# Patient Record
Sex: Male | Born: 1995 | Race: Black or African American | Hispanic: No | Marital: Single | Smoking: Current every day smoker
Health system: Southern US, Community
[De-identification: ages and names within clinical notes are randomized; demographics above are authoritative.]

## PROBLEM LIST (undated history)

## (undated) DIAGNOSIS — R1012 Left upper quadrant pain: Secondary | ICD-10-CM

## (undated) DIAGNOSIS — R079 Chest pain, unspecified: Secondary | ICD-10-CM

## (undated) DIAGNOSIS — Z789 Other specified health status: Secondary | ICD-10-CM

## (undated) HISTORY — DX: Left upper quadrant pain: R10.12

## (undated) HISTORY — DX: Chest pain, unspecified: R07.9

## (undated) SURGERY — EXCISION, CHALAZION
Anesthesia: LOCAL | Laterality: Right

---
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
## 2014-10-29 ENCOUNTER — Emergency Department (HOSPITAL_COMMUNITY): Payer: Medicaid Other

## 2014-10-29 ENCOUNTER — Encounter (HOSPITAL_COMMUNITY): Payer: Self-pay

## 2014-10-29 ENCOUNTER — Emergency Department (HOSPITAL_COMMUNITY)
Admission: EM | Admit: 2014-10-29 | Discharge: 2014-10-29 | Disposition: A | Discharge status: Home / Self Care | Payer: Medicaid Other | Attending: Emergency Medicine | Admitting: Emergency Medicine

## 2014-10-29 DIAGNOSIS — R55 Syncope and collapse: Secondary | ICD-10-CM | POA: Diagnosis not present

## 2014-10-29 DIAGNOSIS — R1110 Vomiting, unspecified: Secondary | ICD-10-CM | POA: Insufficient documentation

## 2014-10-29 DIAGNOSIS — R7989 Other specified abnormal findings of blood chemistry: Secondary | ICD-10-CM | POA: Insufficient documentation

## 2014-10-29 DIAGNOSIS — R531 Weakness: Secondary | ICD-10-CM | POA: Diagnosis not present

## 2014-10-29 LAB — BASIC METABOLIC PANEL
ANION GAP: 7 (ref 5–15)
BUN: 15 mg/dL (ref 6–20)
CHLORIDE: 103 mmol/L (ref 101–111)
CO2: 28 mmol/L (ref 22–32)
Calcium: 9.4 mg/dL (ref 8.9–10.3)
Creatinine, Ser: 1.34 mg/dL — ABNORMAL HIGH (ref 0.61–1.24)
GFR calc non Af Amer: >60 mL/min (ref >60)
Glucose, Bld: 106 mg/dL — ABNORMAL HIGH (ref 65–99)
Potassium: 4.1 mmol/L (ref 3.5–5.1)
SODIUM: 138 mmol/L (ref 135–145)

## 2014-10-29 LAB — CBC
HCT: 44.7 % (ref 39.0–52.0)
Hemoglobin: 15.2 g/dL (ref 13.0–17.0)
MCH: 30.2 pg (ref 26.0–34.0)
MCHC: 34 g/dL (ref 30.0–36.0)
MCV: 88.9 fL (ref 78.0–100.0)
PLATELETS: 180 10*3/uL (ref 150–400)
RBC: 5.03 MIL/uL (ref 4.22–5.81)
RDW: 12.5 % (ref 11.5–15.5)
WBC: 5.2 10*3/uL (ref 4.0–10.5)

## 2014-10-29 MED ORDER — SODIUM CHLORIDE 0.9 % IV BOLUS (SEPSIS)
1000.0000 mL | Freq: Once | INTRAVENOUS | Status: AC
  Administered 2014-10-29: 1000 mL via INTRAVENOUS

## 2014-10-29 NOTE — ED Notes (Signed)
CBG 93 

## 2014-10-29 NOTE — ED Provider Notes (Signed)
CSN: [id number]     Arrival date & time 10/29/14  1250 History   First MD Initiated Contact with Patient 10/29/14 1259     Chief Complaint  Patient presents with  . Near Syncope     (Consider location/radiation/quality/duration/timing/severity/associated sxs/prior Treatment) HPI  Patient presents after an episode of near-syncope. Patient was with his mother, who is here being evaluated for syncope, fall, when she became diaphoretic, lightheaded. No complete collapse, nor complete loss of consciousness. Patient states that he has not eaten substantial amounts today, had no chest pain either before or after the episode. Patient one episode of vomiting after being provided with juice following the event. He acknowledges a history of similar prior episodes over the years, including some with exertion. No firm diagnosis. Patient has a family member with long QT syndrome.  Past Medical History  Diagnosis Date  . Medical history non-contributory    Past Surgical History  Procedure Laterality Date  . Arm fx x 2 Right 2009  . Chalazion excision Right 01/10/2013    Procedure: MINOR EXCISION OF CHALAZION;  Surgeon: Vita Erm., MD;  Location: [facility];  Service: Ophthalmology;  Laterality: Right;   History reviewed. No pertinent family history. History  Substance Use Topics  . Smoking status: Never Smoker   . Smokeless tobacco: Not on file  . Alcohol Use: No    Review of Systems  Constitutional:       Per HPI, otherwise negative  HENT:       Per HPI, otherwise negative  Respiratory:       Per HPI, otherwise negative  Cardiovascular:       Per HPI, otherwise negative  Gastrointestinal: Positive for vomiting.  Endocrine:       Negative aside from HPI  Genitourinary:       Neg aside from HPI   Musculoskeletal:       Per HPI, otherwise negative  Skin: Negative.   Neurological: Positive for weakness and light-headedness. Negative for syncope.       Allergies  Review of patient's allergies indicates no known allergies.  Home Medications   Prior to Admission medications   Not on File   BP 104/56 mmHg  Pulse 66  Temp(Src) 97.9 F (36.6 C) (Oral)  Resp 18  SpO2 100% Physical Exam  Constitutional: He is oriented to person, place, and time. He appears well-developed. No distress.  HENT:  Head: Normocephalic and atraumatic.  Eyes: Conjunctivae and EOM are normal.  Cardiovascular: Normal rate and regular rhythm.   Pulmonary/Chest: Effort normal. No stridor. No respiratory distress.  Abdominal: He exhibits no distension.  Musculoskeletal: He exhibits no edema.  Neurological: He is alert and oriented to person, place, and time.  Skin: Skin is warm and dry.  Psychiatric: He has a normal mood and affect.  Nursing note and vitals reviewed.   ED Course  Procedures (including critical care time) Labs Review Labs Reviewed  BASIC METABOLIC PANEL - Abnormal; Notable for the following:    Glucose, Bld 106 (*)    Creatinine, Ser 1.34 (*)    All other components within normal limits  CBC  CBG MONITORING, ED    Imaging Review Dg Chest 2 View  10/29/2014   CLINICAL DATA:  Near-syncope the day. Dizziness. Initial encounter.  EXAM: CHEST  2 VIEW  COMPARISON:  None.  FINDINGS: Heart size and mediastinal contours are within normal limits. Both lungs are clear. Visualized skeletal structures are unremarkable.  IMPRESSION: Negative exam.  Electronically Signed   By: Drusilla Kannerhomas  Dalessio M.D.   On: 10/29/2014 13:33     EKG Interpretation   Date/Time:  Thursday October 29 2014 13:00:41 EDT Ventricular Rate:  86 PR Interval:  156 QRS Duration: 81 QT Interval:  363 QTC Calculation: 434 R Axis:   97 Text Interpretation:  Sinus rhythm Borderline right axis deviation ST  elevation suggests acute pericarditis Sinus rhythm Early repolarization  ST-t wave abnormality Abnormal ekg Confirmed by Gerhard MunchLOCKWOOD, Khadijah Mastrianni  MD  ([id number]) on 10/29/2014  1:05:32 PM     2:11 PM Patient had another episode of near-syncope. He was placed in reverse Trendelenburg position, received additional IV fluids.  Labs notable for elevated creatinine. Patient has received fluids, will receive additional bolus.  3:15 PM Patient sitting upright, eating. No acute changes. Patient and father aware of all results, including elevated creatinine. With family history of arrhythmia, patient will follow-up with cardiology. MDM  Patient presents after episodes of near syncope. Patient is awake, alert, in no distress for the majority of his time in the emergency department. Patient has a family member who is critically ill here, being admitted for further evaluation, management. Patient's evaluation demonstrates increased creatinine, consistent with his acknowledging poor oral intake recently. EKG reassuring, and following 2 L fluid resuscitation, the patient was symptomatic, in no distress. Given family history of arrhythmia, the patient has seen cardiology in the past, he was encouraged to follow-up with cardiology as an outpatient.   Gerhard Munchobert Kashauna Celmer, MD 10/29/14 [phone]

## 2014-10-29 NOTE — ED Notes (Signed)
Pt was visiting pt in room 36 when he became diaphoretic and near syncopal.  Pt reports he has not eaten today and has no AC in his car.  Pt did not loose consciousness and remained A&Ox4.  Pt was provided with OJ and peanut butter prior to being roomed.  Pt threw up orange juice.

## 2014-10-29 NOTE — ED Notes (Signed)
MD at bedside. 

## 2014-10-29 NOTE — Discharge Instructions (Signed)
As discussed, today's evaluation is demonstrated an elevation in your serum creatinine level. Typically this reflect dehydration, and poor nutrition, as well as overexertion. It is very important that you drink plenty of fluids, stay well-hydrated.  In addition, given your family history of arrhythmia, it is important that you follow-up with our cardiology colleagues to complete the evaluation of today's episode of almost losing consciousness.  Return here for concerning changes in your condition.   Cardiac Event Monitoring A cardiac event monitor is a small recording device used to help detect abnormal heart rhythms (arrhythmias). The monitor is used to record heart rhythm when noticeable symptoms such as the following occur:  Fast heartbeats (palpitations), such as heart racing or fluttering.  Dizziness.  Fainting or light-headedness.  Unexplained weakness. The monitor is wired to two electrodes placed on your chest. Electrodes are flat, sticky disks that attach to your skin. The monitor can be worn for up to 30 days. You will wear the monitor at all times, except when bathing.  HOW TO USE YOUR CARDIAC EVENT MONITOR A technician will prepare your chest for the electrode placement. The technician will show you how to place the electrodes, how to work the monitor, and how to replace the batteries. Take time to practice using the monitor before you leave the office. Make sure you understand how to send the information from the monitor to your health care provider. This requires a telephone with a landline, not a cell phone. You need to:  Wear your monitor at all times, except when you are in water:  Do not get the monitor wet.  Take the monitor off when bathing. Do not swim or use a hot tub with it on.  Keep your skin clean. Do not put body lotion or moisturizer on your chest.  Change the electrodes daily or any time they stop sticking to your skin. You might need to use tape to keep  them on.  It is possible that your skin under the electrodes could become irritated. To keep this from happening, try to put the electrodes in slightly different places on your chest. However, they must remain in the area under your left breast and in the upper right section of your chest.  Make sure the monitor is safely clipped to your clothing or in a location close to your body that your health care provider recommends.  Press the button to record when you feel symptoms of heart trouble, such as dizziness, weakness, light-headedness, palpitations, thumping, shortness of breath, unexplained weakness, or a fluttering or racing heart. The monitor is always on and records what happened slightly before you pressed the button, so do not worry about being too late to get good information.  Keep a diary of your activities, such as walking, doing chores, and taking medicine. It is especially important to note what you were doing when you pushed the button to record your symptoms. This will help your health care provider determine what might be contributing to your symptoms. The information stored in your monitor will be reviewed by your health care provider alongside your diary entries.  Send the recorded information as recommended by your health care provider. It is important to understand that it will take some time for your health care provider to process the results.  Change the batteries as recommended by your health care provider. SEEK IMMEDIATE MEDICAL CARE IF:   You have chest pain.  You have extreme difficulty breathing or shortness of breath.  You  develop a very fast heartbeat that persists.  You develop dizziness that does not go away.  You faint or constantly feel you are about to faint. Document Released: 01/18/2008 Document Revised: 08/25/2013 Document Reviewed: 10/07/2012 [facility] Patient Information 2015 [place], [place]. This information is not intended to replace advice given to  you by your health care provider. Make sure you discuss any questions you have with your health care provider.

## 2014-11-02 LAB — CBG MONITORING, ED: Glucose-Capillary: 93 mg/dL (ref 65–99)

## 2017-03-14 ENCOUNTER — Ambulatory Visit (HOSPITAL_COMMUNITY)
Admission: EM | Admit: 2017-03-14 | Discharge: 2017-03-14 | Disposition: A | Discharge status: Home / Self Care | Payer: Medicaid Other | Attending: Family Medicine | Admitting: Family Medicine

## 2017-03-14 ENCOUNTER — Encounter (HOSPITAL_COMMUNITY): Payer: Self-pay | Admitting: Emergency Medicine

## 2017-03-14 DIAGNOSIS — H109 Unspecified conjunctivitis: Secondary | ICD-10-CM

## 2017-03-14 MED ORDER — ERYTHROMYCIN 5 MG/GM OP OINT: 1.0000 "application " | TOPICAL_OINTMENT | Freq: Four times a day (QID) | OPHTHALMIC | 0 refills | Status: AC

## 2017-03-14 NOTE — Discharge Instructions (Signed)
Avoid touching of eye best as possible. Eye ointment as prescribed. If increased pain or no improvement of symptoms in the next 3-4 days please follow up with eye doctor.

## 2017-03-14 NOTE — ED Triage Notes (Signed)
Pt c/o L eye pain and swelling, Pt left upper eyelid is swollen, eye is red.

## 2017-03-14 NOTE — ED Provider Notes (Signed)
[facility]    CSN: [id number] Arrival date & time: 03/14/17  1340     History   Chief Complaint Chief Complaint  Patient presents with  . Eye Pain    HPI Miguel Meyers is a 21 y.o. male.   Miguel NeedleMichael presents with complaints of left eye tearing redness and irritation which started last night at 1900. Denies changes to vision. Does not wear contacts. Felt like he got dust in his eye, he irrigated it last night. No other known exposures. Discomfort is 6/10. Tearing, without exudate or mattering this morning. Without photophobia. Tried using OTC drops for pink eye which did not help with his symptoms. Denies previous similar, did have a stye removal completed in 2015. Denies uri symptoms.   ROS per HPI.       Past Medical History:  Diagnosis Date  . Medical history non-contributory     There are no active problems to display for this patient.   Past Surgical History:  Procedure Laterality Date  . Arm fx x 2 Right 2009  . CHALAZION EXCISION Right 01/10/2013   Procedure: MINOR EXCISION OF CHALAZION;  Surgeon: Vita Ermhomas E Brewington Jr., MD;  Location: [facility];  Service: Ophthalmology;  Laterality: Right;       Home Medications    Prior to Admission medications   Medication Sig Start Date End Date Taking? Authorizing Provider  cetirizine (ZYRTEC) 10 MG tablet Take 10 mg by mouth daily as needed for allergies.    [provider]  erythromycin ophthalmic ointment Place 1 application into the left eye 4 (four) times daily for 7 days. 03/14/17 03/21/17  Georgetta HaberBurky, Cashay Manganelli B, NP    Family History No family history on file.  Social History Social History   Tobacco Use  . Smoking status: Never Smoker  Substance Use Topics  . Alcohol use: No  . Drug use: No     Allergies   Patient has no known allergies.   Review of Systems Review of Systems   Physical Exam Triage Vital Signs ED Triage Vitals  Enc Vitals Group     BP  03/14/17 1433 129/70     Pulse Rate 03/14/17 1433 98     Resp 03/14/17 1433 18     Temp 03/14/17 1433 97.8 F (36.6 C)     Temp src --      SpO2 03/14/17 1433 100 %     Weight --      Height --      Head Circumference --      Peak Flow --      Pain Score 03/14/17 1434 7     Pain Loc --      Pain Edu? --      Excl. in GC? --    No data found.  Updated Vital Signs BP 129/70   Pulse 98   Temp 97.8 F (36.6 C)   Resp 18   SpO2 100%   Visual Acuity Right Eye Distance: 20/70 Left Eye Distance: 20/70 Bilateral Distance: 20/50  Right Eye Near:   Left Eye Near:    Bilateral Near:     Physical Exam  Constitutional: He is oriented to person, place, and time. He appears well-developed and well-nourished.  Eyes: EOM and lids are normal. Pupils are equal, round, and reactive to light. Lids are everted and swept, no foreign bodies found. Left eye exhibits discharge. Left eye exhibits no exudate. No foreign body present in the left eye.  Left conjunctiva is injected.  Fundoscopic exam:      The left eye shows no exudate.  Slit lamp exam:      The right eye shows no fluorescein uptake.       The left eye shows no corneal abrasion, no corneal ulcer, no foreign body and no fluorescein uptake.  Redness to left eye with mild upper lid edema; tearing regularly  Cardiovascular: Normal rate and regular rhythm.  Pulmonary/Chest: Effort normal and breath sounds normal.  Neurological: He is alert and oriented to person, place, and time.  Skin: Skin is warm and dry.     UC Treatments / Results  Labs (all labs ordered are listed, but only abnormal results are displayed) Labs Reviewed - No data to display  EKG  EKG Interpretation None       Radiology No results found.  Procedures Procedures (including critical care time)  Medications Ordered in UC Medications - No data to display   Initial Impression / Assessment and Plan / UC Course  I have reviewed the triage vital signs  and the nursing notes.  Pertinent labs & imaging results that were available during my care of the patient were reviewed by me and considered in my medical decision making (see chart for details).     Complete course of eye ointment. Return precautions provided. Follow with eye doctor as needed if symptoms do not improve or if worsen. Return immediately if increased pain or changes in vision. Patient verbalized understanding and agreeable to plan.    Final Clinical Impressions(s) / UC Diagnoses   Final diagnoses:  Conjunctivitis of left eye, unspecified conjunctivitis type    ED Discharge Orders        Ordered    erythromycin ophthalmic ointment  4 times daily     03/14/17 1522       Controlled Substance Prescriptions [place] Controlled Substance Registry consulted? Not Applicable   Georgetta HaberBurky, Spencer Paone B, NP 03/14/17 1527

## 2017-04-17 ENCOUNTER — Emergency Department (HOSPITAL_COMMUNITY): Payer: Self-pay

## 2017-04-17 ENCOUNTER — Other Ambulatory Visit: Payer: Self-pay

## 2017-04-17 ENCOUNTER — Encounter (HOSPITAL_COMMUNITY): Payer: Self-pay | Admitting: Nurse Practitioner

## 2017-04-17 ENCOUNTER — Observation Stay (HOSPITAL_COMMUNITY)
Admission: EM | Admit: 2017-04-17 | Discharge: 2017-04-18 | Discharge status: Against Medical Advice | Payer: Self-pay | Attending: Internal Medicine | Admitting: Internal Medicine

## 2017-04-17 DIAGNOSIS — F10239 Alcohol dependence with withdrawal, unspecified: Secondary | ICD-10-CM | POA: Insufficient documentation

## 2017-04-17 DIAGNOSIS — F1910 Other psychoactive substance abuse, uncomplicated: Secondary | ICD-10-CM

## 2017-04-17 DIAGNOSIS — R1011 Right upper quadrant pain: Secondary | ICD-10-CM

## 2017-04-17 DIAGNOSIS — F1210 Cannabis abuse, uncomplicated: Secondary | ICD-10-CM | POA: Insufficient documentation

## 2017-04-17 DIAGNOSIS — F1010 Alcohol abuse, uncomplicated: Secondary | ICD-10-CM

## 2017-04-17 DIAGNOSIS — F17210 Nicotine dependence, cigarettes, uncomplicated: Secondary | ICD-10-CM | POA: Insufficient documentation

## 2017-04-17 DIAGNOSIS — R6510 Systemic inflammatory response syndrome (SIRS) of non-infectious origin without acute organ dysfunction: Principal | ICD-10-CM | POA: Diagnosis present

## 2017-04-17 LAB — URINALYSIS, ROUTINE W REFLEX MICROSCOPIC
BACTERIA UA: NONE SEEN
Bilirubin Urine: NEGATIVE
Glucose, UA: NEGATIVE mg/dL
Hgb urine dipstick: NEGATIVE
KETONES UR: 80 mg/dL — AB
Leukocytes, UA: NEGATIVE
Nitrite: NEGATIVE
PROTEIN: 100 mg/dL — AB
RBC / HPF: NONE SEEN RBC/hpf (ref 0–5)
SQUAMOUS EPITHELIAL / LPF: NONE SEEN
Specific Gravity, Urine: 1.028 (ref 1.005–1.030)
pH: 8 (ref 5.0–8.0)

## 2017-04-17 LAB — CBC
HCT: 41.8 % (ref 39.0–52.0)
HEMATOCRIT: 48.2 % (ref 39.0–52.0)
Hemoglobin: 16.8 g/dL (ref 13.0–17.0)
Hemoglobin: 14.3 g/dL (ref 13.0–17.0)
MCH: 33 pg (ref 26.0–34.0)
MCH: 32.6 pg (ref 26.0–34.0)
MCHC: 34.9 g/dL (ref 30.0–36.0)
MCHC: 34.2 g/dL (ref 30.0–36.0)
MCV: 94.7 fL (ref 78.0–100.0)
MCV: 95.2 fL (ref 78.0–100.0)
PLATELETS: 201 10*3/uL (ref 150–400)
PLATELETS: 159 10*3/uL (ref 150–400)
RBC: 5.09 MIL/uL (ref 4.22–5.81)
RBC: 4.39 MIL/uL (ref 4.22–5.81)
RDW: 12 % (ref 11.5–15.5)
RDW: 12.1 % (ref 11.5–15.5)
WBC: 8.4 10*3/uL (ref 4.0–10.5)
WBC: 5.4 10*3/uL (ref 4.0–10.5)

## 2017-04-17 LAB — MAGNESIUM: MAGNESIUM: 1.7 mg/dL (ref 1.7–2.4)

## 2017-04-17 LAB — CREATININE, SERUM: CREATININE: 1.26 mg/dL — AB (ref 0.61–1.24)

## 2017-04-17 LAB — COMPREHENSIVE METABOLIC PANEL
ALBUMIN: 4.5 g/dL (ref 3.5–5.0)
ALT: 16 U/L — AB (ref 17–63)
AST: 40 U/L (ref 15–41)
Alkaline Phosphatase: 57 U/L (ref 38–126)
Anion gap: 15 (ref 5–15)
BUN: 9 mg/dL (ref 6–20)
CHLORIDE: 100 mmol/L — AB (ref 101–111)
CO2: 20 mmol/L — ABNORMAL LOW (ref 22–32)
CREATININE: 1.44 mg/dL — AB (ref 0.61–1.24)
Calcium: 9.8 mg/dL (ref 8.9–10.3)
GFR calc Af Amer: >60 mL/min (ref >60)
GLUCOSE: 95 mg/dL (ref 65–99)
POTASSIUM: 3.8 mmol/L (ref 3.5–5.1)
Sodium: 135 mmol/L (ref 135–145)
Total Bilirubin: 1.7 mg/dL — ABNORMAL HIGH (ref 0.3–1.2)
Total Protein: 7.4 g/dL (ref 6.5–8.1)

## 2017-04-17 LAB — INFLUENZA PANEL BY PCR (TYPE A & B)
INFLAPCR: NEGATIVE
Influenza B By PCR: NEGATIVE

## 2017-04-17 LAB — PROCALCITONIN: Procalcitonin: <0.1 ng/mL

## 2017-04-17 LAB — LIPASE, BLOOD: LIPASE: 20 U/L (ref 11–51)

## 2017-04-17 LAB — I-STAT CG4 LACTIC ACID, ED: LACTIC ACID, VENOUS: 0.51 mmol/L (ref 0.5–1.9)

## 2017-04-17 LAB — ETHANOL: Alcohol, Ethyl (B): <10 mg/dL (ref <10)

## 2017-04-17 MED ORDER — ONDANSETRON HCL 4 MG/2ML IJ SOLN
4.0000 mg | Freq: Once | INTRAMUSCULAR | Status: AC
  Administered 2017-04-17: 4 mg via INTRAVENOUS
  Filled 2017-04-17: qty 2

## 2017-04-17 MED ORDER — ACETAMINOPHEN 325 MG PO TABS
650.0000 mg | ORAL_TABLET | Freq: Once | ORAL | Status: AC
  Administered 2017-04-17: 650 mg via ORAL
  Filled 2017-04-17: qty 2

## 2017-04-17 MED ORDER — LORAZEPAM 2 MG/ML IJ SOLN: 0.0000 mg | Freq: Four times a day (QID) | INTRAMUSCULAR | Status: DC

## 2017-04-17 MED ORDER — ZOLPIDEM TARTRATE 5 MG PO TABS
5.0000 mg | ORAL_TABLET | Freq: Every evening | ORAL | Status: DC | PRN
  Administered 2017-04-17: 5 mg via ORAL
  Filled 2017-04-17: qty 1

## 2017-04-17 MED ORDER — LORAZEPAM 2 MG/ML IJ SOLN
0.5000 mg | Freq: Once | INTRAMUSCULAR | Status: AC
  Administered 2017-04-17: 0.5 mg via INTRAVENOUS
  Filled 2017-04-17: qty 1

## 2017-04-17 MED ORDER — SODIUM CHLORIDE 0.9 % IV BOLUS (SEPSIS)
2000.0000 mL | Freq: Once | INTRAVENOUS | Status: AC
  Administered 2017-04-17: 2000 mL via INTRAVENOUS

## 2017-04-17 MED ORDER — SODIUM CHLORIDE 0.9 % IV BOLUS (SEPSIS)
1000.0000 mL | Freq: Once | INTRAVENOUS | Status: AC
  Administered 2017-04-17: 1000 mL via INTRAVENOUS

## 2017-04-17 MED ORDER — ENOXAPARIN SODIUM 40 MG/0.4ML ~~LOC~~ SOLN
40.0000 mg | SUBCUTANEOUS | Status: DC
  Administered 2017-04-17: 40 mg via SUBCUTANEOUS
  Filled 2017-04-17: qty 0.4

## 2017-04-17 MED ORDER — ACETAMINOPHEN 650 MG RE SUPP: 650.0000 mg | Freq: Four times a day (QID) | RECTAL | Status: DC | PRN

## 2017-04-17 MED ORDER — THIAMINE HCL 100 MG/ML IJ SOLN
INTRAVENOUS | Status: DC
  Administered 2017-04-17 – 2017-04-18 (×2): via INTRAVENOUS
  Filled 2017-04-17 (×3): qty 1000

## 2017-04-17 MED ORDER — ACETAMINOPHEN 325 MG PO TABS: 650.0000 mg | ORAL_TABLET | Freq: Four times a day (QID) | ORAL | Status: DC | PRN

## 2017-04-17 MED ORDER — LORAZEPAM 2 MG/ML IJ SOLN
0.0000 mg | Freq: Two times a day (BID) | INTRAMUSCULAR | Status: DC
Start: 2017-04-20 — End: 2017-04-18

## 2017-04-17 MED ORDER — LACTATED RINGERS IV SOLN
INTRAVENOUS | Status: DC
  Administered 2017-04-17 – 2017-04-18 (×2): via INTRAVENOUS

## 2017-04-17 NOTE — ED Notes (Signed)
Pt moved prior to other 3 due to BP.

## 2017-04-17 NOTE — ED Provider Notes (Addendum)
Assumed care from Dr. Ranae PalmsYelverton at 4:34 PM. Briefly, the patient is a 21 y.o. male with PMHx of  has a past medical history of Medical history non-contributory. here with nausea and vomiting. Febrile, tachycardic here.  He is awaiting fluids.  Labs Reviewed  COMPREHENSIVE METABOLIC PANEL - Abnormal; Notable for the following components:      Result Value   Chloride 100 (*)    CO2 20 (*)    Creatinine, Ser 1.44 (*)    ALT 16 (*)    Total Bilirubin 1.7 (*)    All other components within normal limits  URINALYSIS, ROUTINE W REFLEX MICROSCOPIC - Abnormal; Notable for the following components:   Ketones, ur 80 (*)    Protein, ur 100 (*)    All other components within normal limits  LIPASE, BLOOD  CBC  MAGNESIUM  INFLUENZA PANEL BY PCR (TYPE A & B)  I-STAT CG4 LACTIC ACID, ED    Course of Care: On my assessment, the patient remains tachycardic, febrile, with soft blood pressures.  While I suspect this is likely viral, given his persistent fever and borderline hypotension, I feel it is best to admit him for observation and fluids.  I have also added on a pro-calcitonin to evaluate possible need for antibiotics.  I initially considered an ultrasound, however the patient denies any ongoing right upper quadrant pain.  He has absolutely no tenderness on my exam.  Feel it is reasonable to continue supportive care for now and reassess.     Shaune PollackIsaacs, Mckinlee Dunk, MD 04/17/17 1647    Shaune PollackIsaacs, Chriss Mannan, MD 04/17/17 [phone]

## 2017-04-17 NOTE — H&P (Signed)
[facility] History and Physical  Miguel Meyers:[id number] DOB: November 27, 1995 DOA: 04/17/2017  Referring physician: PCP: System, Provider Not In   Chief Complaint: Abdominal pain/nausea/vomiting  HPI: Miguel Meyers is a 21 y.o. BM PMHx Tobacco abuse,, EtOH abuse, Polysubstance abuse  Patient states that he woke up this morning with abdominal pain and nausea. Had 4-5 episodes of vomiting. No grossly bloody emesis. Developed tingling in his extremities and carpopedal spasms. Patient also had lightheadedness and shortness of breath. States symptoms have improved. Admits to drinking multiple beers yesterday evening.   ADDENDUM: Unable to fully question patient concerning amount of EtOH and drugs that he uses parents were present.  Patient already admitted to ED physician that he does use alcohol and drugs.  Review of Systems:  Constitutional:  No weight loss, night sweats, positive fevers, chills, fatigue.  HEENT:  Positive headaches, negative difficulty swallowing,Tooth/dental problems,Sore throat,  No sneezing, itching, ear ache, nasal congestion, post nasal drip,  Cardio-vascular:  No chest pain, Orthopnea, PND, swelling in lower extremities, anasarca, dizziness, palpitations  GI:  No heartburn, indigestion, abdominal pain, nausea, vomiting, diarrhea, change in bowel habits, loss of appetite  Resp:  No shortness of breath with exertion or at rest. No excess mucus, no productive cough, No non-productive cough, No coughing up of blood.No change in color of mucus.No wheezing.No chest wall deformity  Skin:  no rash or lesions.  GU:  no dysuria, change in color of urine, no urgency or frequency. No flank pain.  Musculoskeletal:  No joint pain or swelling. No decreased range of motion. No back pain.  Psych:  No change in mood or affect. No depression or anxiety. No memory loss.   Past Medical History:  Diagnosis Date  . Medical history non-contributory    Past  Surgical History:  Procedure Laterality Date  . Arm fx x 2 Right 2009  . CHALAZION EXCISION Right 01/10/2013   Procedure: MINOR EXCISION OF CHALAZION;  Surgeon: Vita Ermhomas E Brewington Jr., MD;  Location: [facility];  Service: Ophthalmology;  Laterality: Right;   Social History:  reports that he has been smoking cigarettes.  He has been smoking about 0.50 packs per day. he has never used smokeless tobacco. He reports that he drinks alcohol. He reports that he uses drugs. Drug: Marijuana.  No Known Allergies  No family history of HTN, diabetes, liver disease, cancer HLD,  Prior to Admission medications   Not on File     Consultants:  None  Procedures/Significant Events:    I have personally reviewed and interpreted all radiology studies and my findings are as above.   VENTILATOR SETTINGS:    Cultures 12/25 blood pending 12/25 urine pending  Antimicrobials: None   Devices    LINES / TUBES:      Continuous Infusions: . lactated ringers      Physical Exam: Vitals:   04/17/17 1508 04/17/17 1515 04/17/17 1630 04/17/17 1645  BP:  (!) 100/42 (!) 104/37 (!) 102/45  Pulse:  (!) 106 96 92  Resp:  (!) 9 19 15   Temp: (!) 101.8 F (38.8 C)     TempSrc: Oral     SpO2:  100% 100% 98%  Weight:      Height:        Wt Readings from Last 3 Encounters:  04/17/17 157 lb (71.2 kg)    General: A/O x4, No acute respiratory distress Neck:  Negative scars, masses, torticollis, lymphadenopathy, JVD Lungs: Clear to auscultation bilaterally without wheezes  or crackles Cardiovascular: Tachycardic, regular rhythm without murmur gallop or rub normal S1 and S2 Abdomen: negative abdominal pain, nondistended, positive soft, bowel sounds, no rebound, no ascites, no appreciable mass Extremities: No significant cyanosis, clubbing, or edema bilateral lower extremities Skin: Negative rashes, lesions, ulcers Psychiatric:  Negative depression, negative anxiety, negative  fatigue, negative mania  Central nervous system:  Cranial nerves II through XII intact, tongue/uvula midline, all extremities muscle strength 5/5, sensation intact throughout, negative dysarthria, negative expressive aphasia, negative receptive aphasia.        Labs on Admission:  Basic Metabolic Panel: Recent Labs  Lab 04/17/17 1059 04/17/17 1137  NA 135  --   K 3.8  --   CL 100*  --   CO2 20*  --   GLUCOSE 95  --   BUN 9  --   CREATININE 1.44*  --   CALCIUM 9.8  --   MG  --  1.7   Liver Function Tests: Recent Labs  Lab 04/17/17 1059  AST 40  ALT 16*  ALKPHOS 57  BILITOT 1.7*  PROT 7.4  ALBUMIN 4.5   Recent Labs  Lab 04/17/17 1059  LIPASE 20   No results for input(s): AMMONIA in the last 168 hours. CBC: Recent Labs  Lab 04/17/17 1059  WBC 8.4  HGB 16.8  HCT 48.2  MCV 94.7  PLT 201   Cardiac Enzymes: No results for input(s): CKTOTAL, CKMB, CKMBINDEX, TROPONINI in the last 168 hours.  BNP (last 3 results) No results for input(s): BNP in the last 8760 hours.  ProBNP (last 3 results) No results for input(s): PROBNP in the last 8760 hours.  CBG: No results for input(s): GLUCAP in the last 168 hours.  Radiological Exams on Admission: Dg Chest 2 View  Result Date: 04/17/2017 CLINICAL DATA:  Pt c/o SOB and N/V x 1 day. Pt states his BP dropped this am. No hx of heart or lung problems. Pt is a current smoker. Pt states he drank etoh and smoked yesterday. EXAM: CHEST  2 VIEW COMPARISON:  10/29/2014 FINDINGS: The heart size and mediastinal contours are within normal limits. Both lungs are clear. No pleural effusion or pneumothorax. The visualized skeletal structures are unremarkable. IMPRESSION: No active cardiopulmonary disease. Electronically Signed   By: Amie Portlandavid  Ormond M.D.   On: 04/17/2017 13:01    EKG: NSR  Assessment/Plan Active Problems:   * No active hospital problems. *  SIRS -Upon admission patient meets criteria for SIRS: Temp> 38 degreesC, HR>  90,  -Negative leukocytosis, negative bands negative left shift will hold on starting antibiotics at this point. - Blood culture, urine culture pending -Not convinced infection.  If infection most likely viral.  Could be secondary to EtOH/substance abuse withdrawal. -Admit to observation hydrate aggressively  EtOH withdrawal?  -CIWA protocol  Substance abuse - patient admits to smoking marijuana: Unable to question in depth secondary to parents being present -UDS pending  Code Status: Full (DVT Prophylaxis: Lovenox Family Communication: Parents at bedside for discussion of plan of care Disposition Plan: Anticipated discharge 12/26   Data Reviewed: Care during the described time interval was provided by me .  I have reviewed this patient's available data, including medical history, events of note, physical examination, and all test results as part of my evaluation.   Time spent: 60 min  Daemion Mcniel, Roselind MessierCURTIS J [facility] Pager [phone]

## 2017-04-17 NOTE — ED Notes (Signed)
CHEST VIEW NOT COMPLETE. ACCIDENTALLY CLICKED OFF.

## 2017-04-17 NOTE — ED Triage Notes (Signed)
Pt sts was drinking etoh and smoking yesterday but says that is his normal woke up this morning and noted he had abdominal pain, 4-5 episodes of emesis. Patient noted tingling paresthesias in bilateral upper and lower extremities and spread throughout body. Upon arrival to ED patient states his body and fingers started cramping up and paresthesias returned, patient hyperventilating. Able to calm patient down and cramping and shob relieved.

## 2017-04-17 NOTE — ED Provider Notes (Addendum)
[facility] PROGRESSIVE CARE Provider Note   CSN: [id number] Arrival date & time: 04/17/17  1028     History   Chief Complaint Chief Complaint  Patient presents with  . Emesis    HPI Miguel Meyers is a 21 y.o. male.  HPI Patient states that he woke up this morning with abdominal pain and nausea.  Had 4-5 episodes of vomiting.  No grossly bloody emesis.  Developed tingling in his extremities and carpopedal spasms.  Patient also had lightheadedness and shortness of breath.  States symptoms have improved.  Admits to drinking multiple beers yesterday evening. Past Medical History:  Diagnosis Date  . Medical history non-contributory     Patient Active Problem List   Diagnosis Date Noted  . SIRS (systemic inflammatory response syndrome) (HCC) 04/17/2017    Past Surgical History:  Procedure Laterality Date  . Arm fx x 2 Right 2009  . CHALAZION EXCISION Right 01/10/2013   Procedure: MINOR EXCISION OF CHALAZION;  Surgeon: Vita Ermhomas E Brewington Jr., MD;  [facility];  Service: Ophthalmology;  Laterality: Right;       Home Medications    Prior to Admission medications   Not on File    Family History No family history on file.  Social History Social History   Tobacco Use  . Smoking status: Current Every Day Smoker    Packs/day: 0.50    Types: Cigarettes  . Smokeless tobacco: Never Used  Substance Use Topics  . Alcohol use: Yes  . Drug use: Yes    Types: Marijuana     Allergies   Patient has no known allergies.   Review of Systems Review of Systems  Constitutional: Negative for chills and fever.  Respiratory: Positive for shortness of breath. Negative for cough.   Cardiovascular: Negative for chest pain, palpitations and leg swelling.  Gastrointestinal: Positive for abdominal pain, nausea and vomiting. Negative for blood in stool, constipation and diarrhea.  Musculoskeletal: Positive for myalgias. Negative for back pain,  neck pain and neck stiffness.  Skin: Negative for rash and wound.  Neurological: Positive for light-headedness and numbness. Negative for syncope, weakness and headaches.  Psychiatric/Behavioral: The patient is nervous/anxious.   All other systems reviewed and are negative.    Physical Exam Updated Vital Signs BP 120/68 (BP Location: Left Arm)   Pulse 71   Temp 98.8 F (37.1 C) (Oral)   Resp 20   Ht 5\' 10"  (1.778 m)   Wt 71.2 kg (157 lb)   SpO2 99%   BMI 22.53 kg/m   Physical Exam  Constitutional: He is oriented to person, place, and time. He appears well-developed and well-nourished. No distress.  HENT:  Head: Normocephalic and atraumatic.  Dry lips  Eyes: EOM are normal. Pupils are equal, round, and reactive to light.  Neck: Normal range of motion. Neck supple.  Cardiovascular: Normal rate and regular rhythm. Exam reveals no gallop and no friction rub.  No murmur heard. Pulmonary/Chest: Effort normal and breath sounds normal. No stridor. No respiratory distress. He has no wheezes. He has no rales. He exhibits no tenderness.  Abdominal: Soft. Bowel sounds are normal. There is no tenderness. There is no rebound and no guarding.  Musculoskeletal: Normal range of motion. He exhibits no edema or tenderness.  Neurological: He is alert and oriented to person, place, and time.  Moves all extremities without focal deficit.  Sensation fully intact.  Skin: Skin is warm and dry. Capillary refill takes less than 2 seconds.  No rash noted. No erythema.  Psychiatric: He has a normal mood and affect. His behavior is normal.  Nursing note and vitals reviewed.    ED Treatments / Results  Labs (all labs ordered are listed, but only abnormal results are displayed) Labs Reviewed  CULTURE, BLOOD (ROUTINE X 2) - Abnormal; Notable for the following components:      Result Value   Culture   (*)    Value: STAPHYLOCOCCUS SPECIES (COAGULASE NEGATIVE) THE SIGNIFICANCE OF ISOLATING THIS ORGANISM  FROM A SINGLE SET OF BLOOD CULTURES WHEN MULTIPLE SETS ARE DRAWN IS UNCERTAIN. PLEASE NOTIFY THE MICROBIOLOGY DEPARTMENT WITHIN ONE WEEK IF SPECIATION AND SENSITIVITIES ARE REQUIRED.    All other components within normal limits  BLOOD CULTURE ID PANEL (REFLEXED) - Abnormal; Notable for the following components:   Staphylococcus species DETECTED (*)    All other components within normal limits  COMPREHENSIVE METABOLIC PANEL - Abnormal; Notable for the following components:   Chloride 100 (*)    CO2 20 (*)    Creatinine, Ser 1.44 (*)    ALT 16 (*)    Total Bilirubin 1.7 (*)    All other components within normal limits  URINALYSIS, ROUTINE W REFLEX MICROSCOPIC - Abnormal; Notable for the following components:   Ketones, ur 80 (*)    Protein, ur 100 (*)    All other components within normal limits  CREATININE, SERUM - Abnormal; Notable for the following components:   Creatinine, Ser 1.26 (*)    All other components within normal limits  COMPREHENSIVE METABOLIC PANEL - Abnormal; Notable for the following components:   Creatinine, Ser 1.25 (*)    Calcium 8.2 (*)    Total Protein 5.0 (*)    Albumin 2.9 (*)    ALT 11 (*)    All other components within normal limits  MAGNESIUM - Abnormal; Notable for the following components:   Magnesium 1.6 (*)    All other components within normal limits  CBC WITH DIFFERENTIAL/PLATELET - Abnormal; Notable for the following components:   RBC 4.14 (*)    Platelets 148 (*)    Monocytes Absolute 1.3 (*)    All other components within normal limits  RAPID URINE DRUG SCREEN, HOSP PERFORMED - Abnormal; Notable for the following components:   Tetrahydrocannabinol POSITIVE (*)    All other components within normal limits  CULTURE, BLOOD (ROUTINE X 2)  URINE CULTURE  LIPASE, BLOOD  CBC  MAGNESIUM  INFLUENZA PANEL BY PCR (TYPE A & B)  PROCALCITONIN  PROCALCITONIN  ETHANOL  HIV ANTIBODY (ROUTINE TESTING)  CBC  I-STAT CG4 LACTIC ACID, ED    EKG  EKG  Interpretation  Date/Time:  Tuesday April 17 2017 10:52:24 EST Ventricular Rate:  91 PR Interval:  136 QRS Duration: 76 QT Interval:  350 QTC Calculation: 430 R Axis:   90 Text Interpretation:  Normal sinus rhythm with sinus arrhythmia Rightward axis Borderline ECG Confirmed by Loren RacerYelverton, Joanell Cressler ([id number]) on 04/17/2017 12:05:24 PM       Radiology Dg Chest 2 View  Result Date: 04/17/2017 CLINICAL DATA:  Pt c/o SOB and N/V x 1 day. Pt states his BP dropped this am. No hx of heart or lung problems. Pt is a current smoker. Pt states he drank etoh and smoked yesterday. EXAM: CHEST  2 VIEW COMPARISON:  10/29/2014 FINDINGS: The heart size and mediastinal contours are within normal limits. Both lungs are clear. No pleural effusion or pneumothorax. The visualized skeletal structures are unremarkable. IMPRESSION: No active cardiopulmonary  disease. Electronically Signed   By: Amie Portland M.D.   On: 04/17/2017 13:01    Procedures Procedures (including critical care time)  Medications Ordered in ED Medications  sodium chloride 0.9 % bolus 2,000 mL (0 mLs Intravenous Stopped 04/17/17 1510)  ondansetron (ZOFRAN) injection 4 mg (4 mg Intravenous Given 04/17/17 1233)  LORazepam (ATIVAN) injection 0.5 mg (0.5 mg Intravenous Given 04/17/17 1238)  acetaminophen (TYLENOL) tablet 650 mg (650 mg Oral Given 04/17/17 1504)  sodium chloride 0.9 % bolus 1,000 mL (0 mLs Intravenous Stopped 04/17/17 1636)     Initial Impression / Assessment and Plan / ED Course  I have reviewed the triage vital signs and the nursing notes.  Pertinent labs & imaging results that were available during my care of the patient were reviewed by me and considered in my medical decision making (see chart for details).      Patient states he is feeling much better after IV fluids.  Denies any abdominal pain.  He does have mild frontal headache.  No neck stiffness.  Repeat temperature is 101.8.  Has had family member with URI  symptoms.  Patient with persistent borderline hypotension and tachycardia despite IV fluids.  Abdominal exam is soft and nontender.  Normal lactic acid.  Low suspicion for sepsis.  Pending repeat bolus of IV fluids and reevaluation.  Turned over to oncoming emergency physician. Final Clinical Impressions(s) / ED Diagnoses   Final diagnoses:  RUQ pain    ED Discharge Orders    None       Loren Racer, MD 04/19/17 1052    Loren Racer, MD 04/19/17 1056

## 2017-04-18 DIAGNOSIS — R6510 Systemic inflammatory response syndrome (SIRS) of non-infectious origin without acute organ dysfunction: Principal | ICD-10-CM

## 2017-04-18 LAB — BLOOD CULTURE ID PANEL (REFLEXED)
ACINETOBACTER BAUMANNII: NOT DETECTED
CANDIDA ALBICANS: NOT DETECTED
CANDIDA GLABRATA: NOT DETECTED
CANDIDA KRUSEI: NOT DETECTED
CANDIDA PARAPSILOSIS: NOT DETECTED
CANDIDA TROPICALIS: NOT DETECTED
ENTEROBACTERIACEAE SPECIES: NOT DETECTED
ESCHERICHIA COLI: NOT DETECTED
Enterobacter cloacae complex: NOT DETECTED
Enterococcus species: NOT DETECTED
Haemophilus influenzae: NOT DETECTED
KLEBSIELLA OXYTOCA: NOT DETECTED
KLEBSIELLA PNEUMONIAE: NOT DETECTED
Listeria monocytogenes: NOT DETECTED
Methicillin resistance: NOT DETECTED
Neisseria meningitidis: NOT DETECTED
PSEUDOMONAS AERUGINOSA: NOT DETECTED
Proteus species: NOT DETECTED
STREPTOCOCCUS PNEUMONIAE: NOT DETECTED
STREPTOCOCCUS PYOGENES: NOT DETECTED
Serratia marcescens: NOT DETECTED
Staphylococcus aureus (BCID): NOT DETECTED
Staphylococcus species: DETECTED — AB
Streptococcus agalactiae: NOT DETECTED
Streptococcus species: NOT DETECTED

## 2017-04-18 LAB — CBC WITH DIFFERENTIAL/PLATELET
BASOS ABS: 0 10*3/uL (ref 0.0–0.1)
Basophils Relative: 0 %
EOS PCT: 0 %
Eosinophils Absolute: 0 10*3/uL (ref 0.0–0.7)
HCT: 39.6 % (ref 39.0–52.0)
HEMOGLOBIN: 13.5 g/dL (ref 13.0–17.0)
LYMPHS ABS: 1.4 10*3/uL (ref 0.7–4.0)
LYMPHS PCT: 29 %
MCH: 32.6 pg (ref 26.0–34.0)
MCHC: 34.1 g/dL (ref 30.0–36.0)
MCV: 95.7 fL (ref 78.0–100.0)
Monocytes Absolute: 1.3 10*3/uL — ABNORMAL HIGH (ref 0.1–1.0)
Monocytes Relative: 26 %
NEUTROS PCT: 45 %
Neutro Abs: 2.2 10*3/uL (ref 1.7–7.7)
PLATELETS: 148 10*3/uL — AB (ref 150–400)
RBC: 4.14 MIL/uL — AB (ref 4.22–5.81)
RDW: 12.3 % (ref 11.5–15.5)
WBC: 4.9 10*3/uL (ref 4.0–10.5)

## 2017-04-18 LAB — COMPREHENSIVE METABOLIC PANEL
ALT: 11 U/L — ABNORMAL LOW (ref 17–63)
ANION GAP: 6 (ref 5–15)
AST: 20 U/L (ref 15–41)
Albumin: 2.9 g/dL — ABNORMAL LOW (ref 3.5–5.0)
Alkaline Phosphatase: 42 U/L (ref 38–126)
BUN: 6 mg/dL (ref 6–20)
CHLORIDE: 106 mmol/L (ref 101–111)
CO2: 25 mmol/L (ref 22–32)
CREATININE: 1.25 mg/dL — AB (ref 0.61–1.24)
Calcium: 8.2 mg/dL — ABNORMAL LOW (ref 8.9–10.3)
Glucose, Bld: 84 mg/dL (ref 65–99)
POTASSIUM: 4 mmol/L (ref 3.5–5.1)
SODIUM: 137 mmol/L (ref 135–145)
Total Bilirubin: 0.7 mg/dL (ref 0.3–1.2)
Total Protein: 5 g/dL — ABNORMAL LOW (ref 6.5–8.1)

## 2017-04-18 LAB — RAPID URINE DRUG SCREEN, HOSP PERFORMED
Amphetamines: NOT DETECTED
BENZODIAZEPINES: NOT DETECTED
Barbiturates: NOT DETECTED
COCAINE: NOT DETECTED
OPIATES: NOT DETECTED
Tetrahydrocannabinol: POSITIVE — AB

## 2017-04-18 LAB — HIV ANTIBODY (ROUTINE TESTING W REFLEX): HIV SCREEN 4TH GENERATION: NONREACTIVE

## 2017-04-18 LAB — MAGNESIUM: MAGNESIUM: 1.6 mg/dL — AB (ref 1.7–2.4)

## 2017-04-18 LAB — PROCALCITONIN: Procalcitonin: <0.1 ng/mL

## 2017-04-18 NOTE — Progress Notes (Signed)
Patient wanted to leave Dr. Eather ColasAware. Not medically ready per doctor, patient signed out AMA.

## 2017-04-18 NOTE — Discharge Summary (Signed)
Discharge Summary  Miguel Meyers. Amison ZOX:[id number] DOB: 25-Dec-1995  PCP: System, Provider Not In  Admit date: 04/17/2017 Discharge date: 04/18/2017  Time spent: <71mins  Recommendations for Outpatient Follow-up:  PCP  Discharge Diagnoses:  Active Hospital Problems   Diagnosis Date Noted  . SIRS (systemic inflammatory response syndrome) (HCC) 04/17/2017    Resolved Hospital Problems  No resolved problems to display.    Discharge Condition: SIGNED AMA  Diet recommendation: SIGNED AMA  Vitals:   04/18/17 0518 04/18/17 1157  BP: 126/66 120/68  Pulse: 81 71  Resp: (!) 22 20  Temp: 99.3 F (37.4 C) 98.8 F (37.1 C)  SpO2: 99% 99%    History of present illness:  Miguel Meyers is a 21 y.o. Male with a hx of tobacco abuse, EtOH abuse, marijuana abuse presents to the ED c/o abdominal pain, non-bloody vomiting, nausea. Patient states that he woke up this morning with abdominal pain and nausea. Had 4-5 episodes of NBNB vomiting, vomitus contained recently ingested pasta. Reported generalized abdominal pain. By the time pt arrived to the ED, symptoms have improved. Admits to drinking multiple beers the night before arrival. Pt admits to drinking alcohol daily, 1 bottle of hard liquor over 2-3 days. Reports marijuana use, denied any other illicit drug use. Pt denied any diarrhea, prior fever/chills, dizziness. In the ED, pt was noted to have a spike in temp, mildly tachycardic. Pt admitted for further management  Today, pt reported feeling much better, denied any new complaints. Denies any abdominal pain, N/V/D/C, fever/chills. Pt signed [facility]   Hospital Course:  Active Problems:   SIRS (systemic inflammatory response syndrome) (HCC)  SIRS ?? Viral gastroenteritis Vs unlikely bacterial inf -Upon admission patient meets criteria for SIRS: Temp> 38 degreesC, HR> 90,  -Negative leukocytosis, negative bands negative left shift, no antibiotics started -Blood culture 1/4 grew  staph specie coagulase neg, likely contaminant  --Urine culture pending --Procalcitonin negative  EtOH withdrawal Stable, signed AMA  Substance abuse -UDS positive for marijuana only Advised against tobacco, alcohol and marijuana abuse Signed AMA    Procedures:  None  Consultations:  None  Discharge Exam: BP 120/68 (BP Location: Left Arm)   Pulse 71   Temp 98.8 F (37.1 C) (Oral)   Resp 20   Ht 5\' 10"  (1.778 m)   Wt 71.2 kg (157 lb)   SpO2 99%   BMI 22.53 kg/m   General: AAO X 3, not in distress Cardiovascular: S1-S2 present, no added heart sound Respiratory: Chest clear bilaterally   Discharge Instructions You were cared for by a hospitalist during your hospital stay. If you have any questions about your discharge medications or the care you received while you were in the hospital after you are discharged, you can call the unit and asked to speak with the hospitalist on call if the hospitalist that took care of you is not available. Once you are discharged, your primary care physician will handle any further medical issues. Please note that NO REFILLS for any discharge medications will be authorized once you are discharged, as it is imperative that you return to your primary care physician (or establish a relationship with a primary care physician if you do not have one) for your aftercare needs so that they can reassess your need for medications and monitor your lab values.   Allergies as of 04/18/2017   No Known Allergies     Medication List    You have not been prescribed any medications.  No Known Allergies    The results of significant diagnostics from this hospitalization (including imaging, microbiology, ancillary and laboratory) are listed below for reference.    Significant Diagnostic Studies: Dg Chest 2 View  Result Date: 04/17/2017 CLINICAL DATA:  Pt c/o SOB and N/V x 1 day. Pt states his BP dropped this am. No hx of heart or lung problems.  Pt is a current smoker. Pt states he drank etoh and smoked yesterday. EXAM: CHEST  2 VIEW COMPARISON:  10/29/2014 FINDINGS: The heart size and mediastinal contours are within normal limits. Both lungs are clear. No pleural effusion or pneumothorax. The visualized skeletal structures are unremarkable. IMPRESSION: No active cardiopulmonary disease. Electronically Signed   By: Amie Portlandavid  Ormond M.D.   On: 04/17/2017 13:01    Microbiology: Recent Results (from the past 240 hour(s))  Culture, blood (routine x 2)     Status: None (Preliminary result)   Collection Time: 04/17/17  6:41 PM  Result Value Ref Range Status   Specimen Description BLOOD RIGHT ANTECUBITAL  Final   Special Requests   Final    IN BOTH AEROBIC AND ANAEROBIC BOTTLES Blood Culture adequate volume   Culture NO GROWTH < 24 HOURS  Final   Report Status PENDING  Incomplete  Culture, blood (routine x 2)     Status: None (Preliminary result)   Collection Time: 04/17/17  6:47 PM  Result Value Ref Range Status   Specimen Description BLOOD LEFT HAND  Final   Special Requests   Final    IN BOTH AEROBIC AND ANAEROBIC BOTTLES Blood Culture adequate volume   Culture  Setup Time   Final    GRAM POSITIVE COCCI ANAEROBIC BOTTLE ONLY Organism ID to follow CRITICAL RESULT CALLED TO, READ BACK BY AND VERIFIED WITH: N. Batchelder Pharm.D. 13:25 04/18/17 ([email or web address])    Culture GRAM POSITIVE COCCI  Final   Report Status PENDING  Incomplete  Blood Culture ID Panel (Reflexed)     Status: Abnormal   Collection Time: 04/17/17  6:47 PM  Result Value Ref Range Status   Enterococcus species NOT DETECTED NOT DETECTED Final   Listeria monocytogenes NOT DETECTED NOT DETECTED Final   Staphylococcus species DETECTED (A) NOT DETECTED Final    Comment: Methicillin (oxacillin) susceptible coagulase negative staphylococcus. Possible blood culture contaminant (unless isolated from more than one blood culture draw or clinical case suggests pathogenicity). No  antibiotic treatment is indicated for blood  culture contaminants. CRITICAL RESULT CALLED TO, READ BACK BY AND VERIFIED WITH: N. Batchelder Pharm.D. 13:25 04/18/17 ([email or web address])    Staphylococcus aureus NOT DETECTED NOT DETECTED Final   Methicillin resistance NOT DETECTED NOT DETECTED Final   Streptococcus species NOT DETECTED NOT DETECTED Final   Streptococcus agalactiae NOT DETECTED NOT DETECTED Final   Streptococcus pneumoniae NOT DETECTED NOT DETECTED Final   Streptococcus pyogenes NOT DETECTED NOT DETECTED Final   Acinetobacter baumannii NOT DETECTED NOT DETECTED Final   Enterobacteriaceae species NOT DETECTED NOT DETECTED Final   Enterobacter cloacae complex NOT DETECTED NOT DETECTED Final   Escherichia coli NOT DETECTED NOT DETECTED Final   Klebsiella oxytoca NOT DETECTED NOT DETECTED Final   Klebsiella pneumoniae NOT DETECTED NOT DETECTED Final   Proteus species NOT DETECTED NOT DETECTED Final   Serratia marcescens NOT DETECTED NOT DETECTED Final   Haemophilus influenzae NOT DETECTED NOT DETECTED Final   Neisseria meningitidis NOT DETECTED NOT DETECTED Final   Pseudomonas aeruginosa NOT DETECTED NOT DETECTED Final   Candida albicans NOT DETECTED NOT DETECTED Final  Candida glabrata NOT DETECTED NOT DETECTED Final   Candida krusei NOT DETECTED NOT DETECTED Final   Candida parapsilosis NOT DETECTED NOT DETECTED Final   Candida tropicalis NOT DETECTED NOT DETECTED Final     Labs: Basic Metabolic Panel: Recent Labs  Lab 04/17/17 1059 04/17/17 1137 04/17/17 1850 04/18/17 0237  NA 135  --   --  137  K 3.8  --   --  4.0  CL 100*  --   --  106  CO2 20*  --   --  25  GLUCOSE 95  --   --  84  BUN 9  --   --  6  CREATININE 1.44*  --  1.26* 1.25*  CALCIUM 9.8  --   --  8.2*  MG  --  1.7  --  1.6*   Liver Function Tests: Recent Labs  Lab 04/17/17 1059 04/18/17 0237  AST 40 20  ALT 16* 11*  ALKPHOS 57 42  BILITOT 1.7* 0.7  PROT 7.4 5.0*  ALBUMIN 4.5 2.9*   Recent  Labs  Lab 04/17/17 1059  LIPASE 20   No results for input(s): AMMONIA in the last 168 hours. CBC: Recent Labs  Lab 04/17/17 1059 04/17/17 1850 04/18/17 0237  WBC 8.4 5.4 4.9  NEUTROABS  --   --  2.2  HGB 16.8 14.3 13.5  HCT 48.2 41.8 39.6  MCV 94.7 95.2 95.7  PLT 201 159 148*   Cardiac Enzymes: No results for input(s): CKTOTAL, CKMB, CKMBINDEX, TROPONINI in the last 168 hours. BNP: BNP (last 3 results) No results for input(s): BNP in the last 8760 hours.  ProBNP (last 3 results) No results for input(s): PROBNP in the last 8760 hours.  CBG: No results for input(s): GLUCAP in the last 168 hours.     Signed:  Briant CedarNkeiruka J Ezenduka, MD [facility] 04/18/2017, 5:13 PM

## 2017-04-18 NOTE — Plan of Care (Signed)
Patient rested through the shift tolerated PO fluids and ordered IV fluids. Patient denies pain/nausea/vomiting since admission.

## 2017-04-18 NOTE — Progress Notes (Signed)
PHARMACY - PHYSICIAN COMMUNICATION CRITICAL VALUE ALERT - BLOOD CULTURE IDENTIFICATION (BCID)  Miguel Meyers is an 21 y.o. male who presented to [name] on 04/17/2017 with a chief complaint of abdominal pain, N/V.  Assessment:   21 yo met SIRs criteria but most likely related to acohol abuse. Abx were not continued on admission appropriately.  Name of physician (or Provider) Contacted: Kendal Hymen  Current antibiotics: None  Changes to prescribed antibiotics recommended: No need to start antibiotics  Recommendations accepted by provider  Results for orders placed or performed during the hospital encounter of 04/17/17  Blood Culture ID Panel (Reflexed) (Collected: 04/17/2017  6:47 PM)  Result Value Ref Range   Enterococcus species NOT DETECTED NOT DETECTED   Listeria monocytogenes NOT DETECTED NOT DETECTED   Staphylococcus species DETECTED (A) NOT DETECTED   Staphylococcus aureus NOT DETECTED NOT DETECTED   Methicillin resistance NOT DETECTED NOT DETECTED   Streptococcus species NOT DETECTED NOT DETECTED   Streptococcus agalactiae NOT DETECTED NOT DETECTED   Streptococcus pneumoniae NOT DETECTED NOT DETECTED   Streptococcus pyogenes NOT DETECTED NOT DETECTED   Acinetobacter baumannii NOT DETECTED NOT DETECTED   Enterobacteriaceae species NOT DETECTED NOT DETECTED   Enterobacter cloacae complex NOT DETECTED NOT DETECTED   Escherichia coli NOT DETECTED NOT DETECTED   Klebsiella oxytoca NOT DETECTED NOT DETECTED   Klebsiella pneumoniae NOT DETECTED NOT DETECTED   Proteus species NOT DETECTED NOT DETECTED   Serratia marcescens NOT DETECTED NOT DETECTED   Haemophilus influenzae NOT DETECTED NOT DETECTED   Neisseria meningitidis NOT DETECTED NOT DETECTED   Pseudomonas aeruginosa NOT DETECTED NOT DETECTED   Candida albicans NOT DETECTED NOT DETECTED   Candida glabrata NOT DETECTED NOT DETECTED   Candida krusei NOT DETECTED NOT DETECTED   Candida parapsilosis NOT DETECTED  NOT DETECTED   Candida tropicalis NOT DETECTED NOT DETECTED    Bertine Schlottman J 04/18/2017  1:29 PM

## 2017-04-19 LAB — CULTURE, BLOOD (ROUTINE X 2)

## 2017-04-19 LAB — URINE CULTURE: Culture: NO GROWTH

## 2017-04-22 LAB — CULTURE, BLOOD (ROUTINE X 2): CULTURE: NO GROWTH

## 2019-01-17 ENCOUNTER — Other Ambulatory Visit: Payer: Self-pay

## 2019-01-17 DIAGNOSIS — Z20822 Contact with and (suspected) exposure to covid-19: Secondary | ICD-10-CM

## 2019-01-18 LAB — NOVEL CORONAVIRUS, NAA: SARS-CoV-2, NAA: NOT DETECTED

## 2019-07-06 IMAGING — CR DG CHEST 2V
3 series · 3 of 3 positions shown · non-contrast
Comparison: 10/29/2014

CLINICAL DATA: Pt c/o SOB and N/V x 1 day. Pt states his BP dropped
this am. No hx of heart or lung problems. Pt is a current smoker. Pt
states he drank etoh and smoked yesterday.

EXAM:
CHEST  2 VIEW

[chest lat]
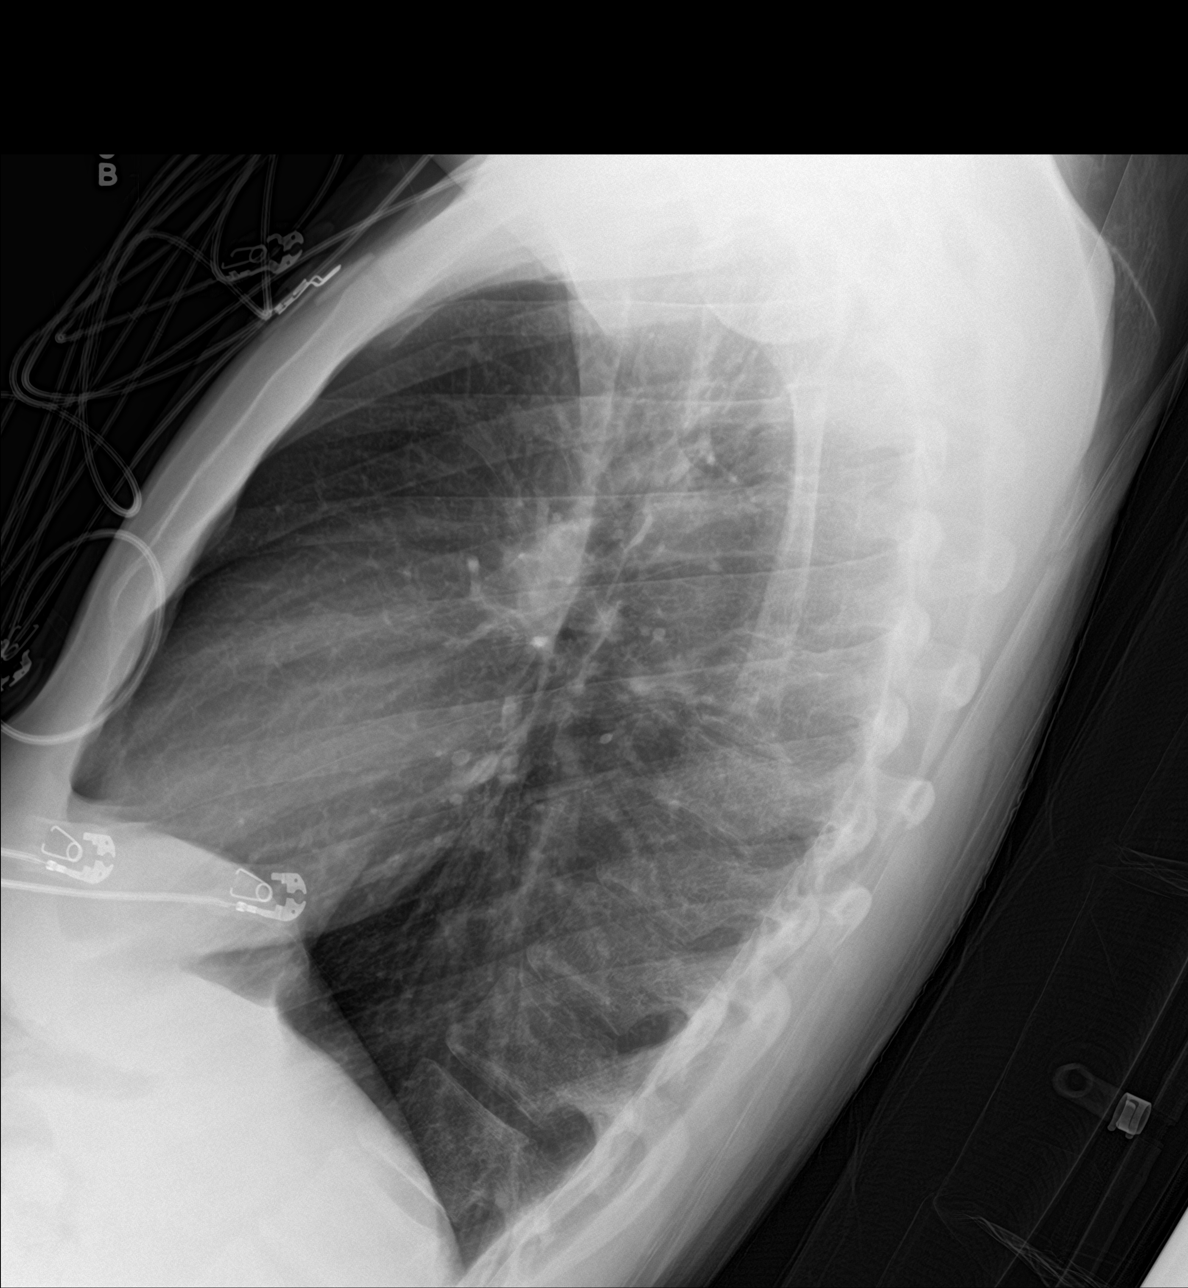

[chest ap (1 of 2)]
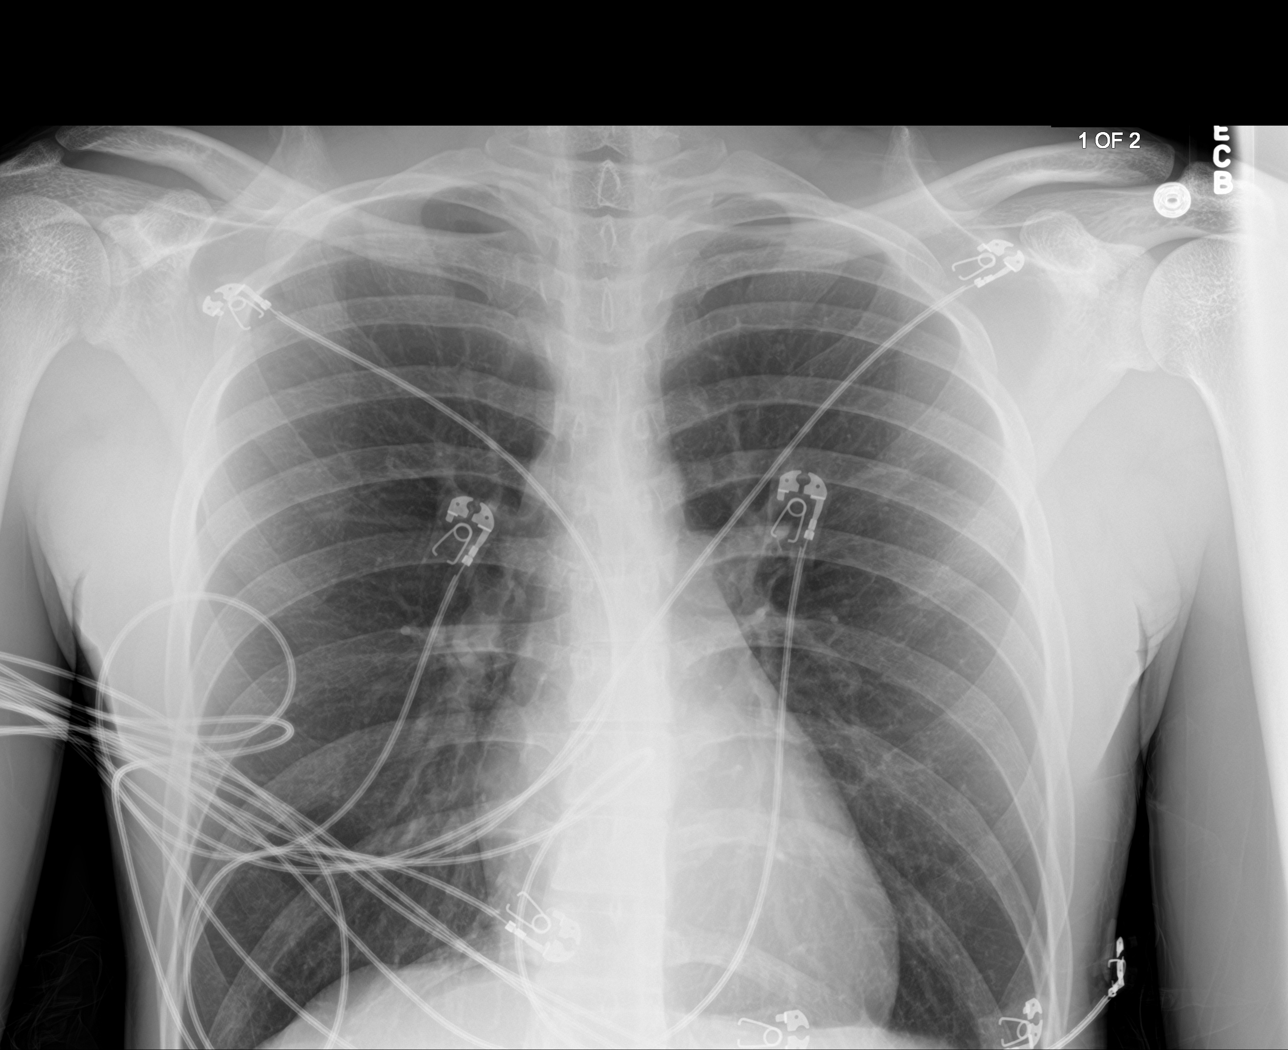

[chest ap (2 of 2)]
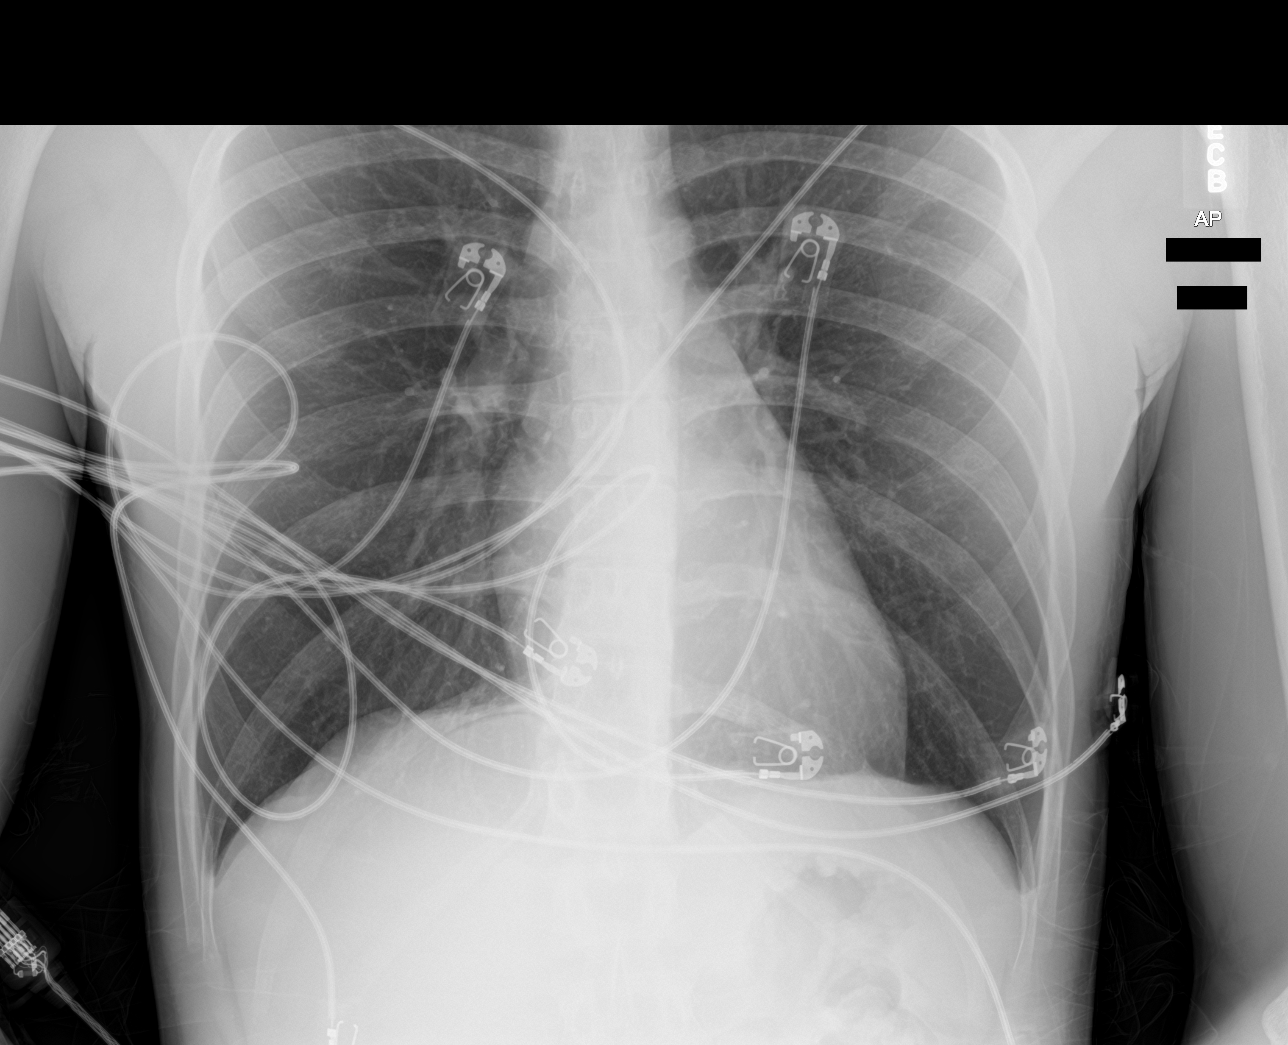

[3 of 3 positions shown; findings below may reference images not displayed]

FINDINGS: The heart size and mediastinal contours are within normal limits.
Both lungs are clear. No pleural effusion or pneumothorax. The
visualized skeletal structures are unremarkable.
IMPRESSION: No active cardiopulmonary disease.

## 2020-01-23 ENCOUNTER — Encounter (HOSPITAL_COMMUNITY): Payer: Self-pay | Admitting: Emergency Medicine

## 2020-01-23 ENCOUNTER — Emergency Department (HOSPITAL_COMMUNITY)
Admission: EM | Admit: 2020-01-23 | Discharge: 2020-01-23 | Disposition: A | Discharge status: Home / Self Care | Payer: Self-pay | Attending: Emergency Medicine | Admitting: Emergency Medicine

## 2020-01-23 DIAGNOSIS — S161XXA Strain of muscle, fascia and tendon at neck level, initial encounter: Secondary | ICD-10-CM | POA: Insufficient documentation

## 2020-01-23 DIAGNOSIS — F17210 Nicotine dependence, cigarettes, uncomplicated: Secondary | ICD-10-CM | POA: Insufficient documentation

## 2020-01-23 DIAGNOSIS — W19XXXA Unspecified fall, initial encounter: Secondary | ICD-10-CM | POA: Insufficient documentation

## 2020-01-23 DIAGNOSIS — Y92003 Bedroom of unspecified non-institutional (private) residence as the place of occurrence of the external cause: Secondary | ICD-10-CM | POA: Insufficient documentation

## 2020-01-23 MED ORDER — CYCLOBENZAPRINE HCL 10 MG PO TABS: 10.0000 mg | ORAL_TABLET | Freq: Two times a day (BID) | ORAL | 0 refills | Status: AC | PRN

## 2020-01-23 NOTE — ED Notes (Signed)
Patient provided heat back to L side of neck for comfort.

## 2020-01-23 NOTE — Discharge Instructions (Signed)
You were evaluated in the Emergency Department and after careful evaluation, we did not find any emergent condition requiring admission or further testing in the hospital.  Your exam/testing today is overall reassuring.  Your symptoms seem to be due to muscle strain or spasm of the neck.  This will improve with time.  Please take Tylenol  Please return to the Emergency Department if you experience any worsening of your condition.   Thank you for allowing [place] to be a part of your care.

## 2020-01-23 NOTE — ED Provider Notes (Signed)
[facility]-EMERGENCY DEPT [facility] Emergency Department Provider Note MRN:  [id number]  Arrival date & time: 01/23/20     Chief Complaint   Neck Pain   History of Present Illness   Erice Ahles. Hewitt is a 24 y.o. year-old male with no pertinent past medical history presenting to the ED with chief complaint of neck pain.  Patient helped his sister move yesterday, felt generally tired but overall well, went to bed.  Woke up this morning at 4 AM with left-sided neck pain and neck stiffness.  Pain is moderate to severe, much worse when trying to move the neck.  Denies any trauma, no fever, no numbness or weakness to the arms or legs, no bowel or bladder dysfunction.  Review of Systems  A complete 10 system review of systems was obtained and all systems are negative except as noted in the HPI and PMH.   Patient's Health History    Past Medical History:  Diagnosis Date  . Medical history non-contributory     Past Surgical History:  Procedure Laterality Date  . Arm fx x 2 Right 2009  . CHALAZION EXCISION Right 01/10/2013   Procedure: MINOR EXCISION OF CHALAZION;  Surgeon: Vita Erm., MD;  Location: [facility];  Service: Ophthalmology;  Laterality: Right;    No family history on file.  Social History   Socioeconomic History  . Marital status: Single    Spouse name: Not on file  . Number of children: Not on file  . Years of education: Not on file  . Highest education level: Not on file  Occupational History  . Not on file  Tobacco Use  . Smoking status: Current Every Day Smoker    Packs/day: 0.50    Types: Cigarettes  . Smokeless tobacco: Never Used  Substance and Sexual Activity  . Alcohol use: Yes  . Drug use: Yes    Types: Marijuana  . Sexual activity: Not on file  Other Topics Concern  . Not on file  Social History Narrative  . Not on file   Social Determinants of Health   Financial Resource Strain:   . Difficulty of Paying Living  Expenses: Not on file  Food Insecurity:   . Worried About Programme researcher, broadcasting/film/video in the Last Year: Not on file  . Ran Out of Food in the Last Year: Not on file  Transportation Needs:   . Lack of Transportation (Medical): Not on file  . Lack of Transportation (Non-Medical): Not on file  Physical Activity:   . Days of Exercise per Week: Not on file  . Minutes of Exercise per Session: Not on file  Stress:   . Feeling of Stress : Not on file  Social Connections:   . Frequency of Communication with Friends and Family: Not on file  . Frequency of Social Gatherings with Friends and Family: Not on file  . Attends Religious Services: Not on file  . Active Member of Clubs or Organizations: Not on file  . Attends Banker Meetings: Not on file  . Marital Status: Not on file  Intimate Partner Violence:   . Fear of Current or Ex-Partner: Not on file  . Emotionally Abused: Not on file  . Physically Abused: Not on file  . Sexually Abused: Not on file     Physical Exam   Vitals:   01/23/20 0859 01/23/20 1048  BP: 122/76 119/83  Pulse: 72 62  Resp: 12 15  Temp: 98.6 F (37  C) 98.7 F (37.1 C)  SpO2: 100% 100%    CONSTITUTIONAL: Well-appearing, NAD NEURO:  Alert and oriented x 3, no focal deficits EYES:  eyes equal and reactive ENT/NECK:  no LAD, no JVD CARDIO: Regular rate, well-perfused, normal S1 and S2 PULM:  CTAB no wheezing or rhonchi GI/GU:  normal bowel sounds, non-distended, non-tender MSK/SPINE:  No gross deformities, no edema, no midline spinal tenderness, some tenderness to palpation to the left lateral neck, pain elicited with attempted range of motion of the neck SKIN:  no rash, atraumatic PSYCH:  Appropriate speech and behavior  *Additional and/or pertinent findings included in MDM below  Diagnostic and Interventional Summary    EKG Interpretation  Date/Time:    Ventricular Rate:    PR Interval:    QRS Duration:   QT Interval:    QTC Calculation:   R  Axis:     Text Interpretation:        Labs Reviewed - No data to display  No orders to display    Medications - No data to display   Procedures  /  Critical Care Procedures  ED Course and Medical Decision Making  I have reviewed the triage vital signs, the nursing notes, and pertinent available records from the EMR.  Listed above are laboratory and imaging tests that I personally ordered, reviewed, and interpreted and then considered in my medical decision making (see below for details).  Consistent with cervical sprain or spasm, appropriate for discharge with pain regimen.       Elmer Sow. Pilar Plate, MD [facility] [email or web address]  Final Clinical Impressions(s) / ED Diagnoses     ICD-10-CM   1. Acute strain of neck muscle, initial encounter  S16.1XXA     ED Discharge Orders         Ordered    cyclobenzaprine (FLEXERIL) 10 MG tablet  2 times daily PRN        01/23/20 1042           Discharge Instructions Discussed with and Provided to Patient:     Discharge Instructions     You were evaluated in the Emergency Department and after careful evaluation, we did not find any emergent condition requiring admission or further testing in the hospital.  Your exam/testing today is overall reassuring.  Your symptoms seem to be due to muscle strain or spasm of the neck.  This will improve with time.  Please take Tylenol  Please return to the Emergency Department if you experience any worsening of your condition.   Thank you for allowing [place] to be a part of your care.       Sabas Sous, MD 01/23/20 1254

## 2020-01-23 NOTE — ED Triage Notes (Signed)
Pt states he woke up earlier this am with pain to L side of neck, thinks he may have slept wrong. Difficulty turning head due to pain/tightness.

## 2022-07-26 DIAGNOSIS — M25561 Pain in right knee: Secondary | ICD-10-CM | POA: Diagnosis not present

## 2022-07-26 DIAGNOSIS — S8391XA Sprain of unspecified site of right knee, initial encounter: Secondary | ICD-10-CM | POA: Diagnosis not present

## 2022-09-11 DIAGNOSIS — Z113 Encounter for screening for infections with a predominantly sexual mode of transmission: Secondary | ICD-10-CM | POA: Diagnosis not present

## 2022-09-11 DIAGNOSIS — N4889 Other specified disorders of penis: Secondary | ICD-10-CM | POA: Diagnosis not present

## 2023-02-27 DIAGNOSIS — R079 Chest pain, unspecified: Secondary | ICD-10-CM | POA: Diagnosis not present

## 2023-02-27 DIAGNOSIS — K209 Esophagitis, unspecified without bleeding: Secondary | ICD-10-CM | POA: Diagnosis not present

## 2023-02-27 DIAGNOSIS — R072 Precordial pain: Secondary | ICD-10-CM | POA: Diagnosis not present

## 2023-05-25 DIAGNOSIS — R1012 Left upper quadrant pain: Secondary | ICD-10-CM | POA: Diagnosis not present

## 2023-05-25 DIAGNOSIS — R0789 Other chest pain: Secondary | ICD-10-CM | POA: Diagnosis not present

## 2023-05-25 DIAGNOSIS — R079 Chest pain, unspecified: Secondary | ICD-10-CM | POA: Diagnosis not present

## 2023-05-25 DIAGNOSIS — R109 Unspecified abdominal pain: Secondary | ICD-10-CM | POA: Diagnosis not present

## 2023-08-15 ENCOUNTER — Other Ambulatory Visit: Payer: Self-pay

## 2023-09-05 NOTE — Progress Notes (Deleted)
 Cardiology Office Note:   Date:  09/09/2023  ID:  Miguel Meyers, Miguel Meyers 1996-02-21, MRN [id number] PCP:  System, Provider Not In  [facility] Providers Cardiologist:  Alyssa Backbone, MD Referring MD: Vance Gell, MD  Chief Complaint/Reason for Referral: Chest pain ASSESSMENT:    1. Precordial pain   2. Tobacco abuse     PLAN:   In order of problems listed above: Chest pain: Seems to be due to a GI etiology.*** Tobacco abuse:***        {Are you ordering a CV Procedure (e.g. stress test, cath, DCCV, TEE, etc)?   Press F2        :[id number]}   Dispo:  No follow-ups on file.      Medication Adjustments/Labs and Tests Ordered: Current medicines are reviewed at length with the patient today.  Concerns regarding medicines are outlined above.  The following changes have been made:  {PLAN; NO CHANGE:13088:s}   Labs/tests ordered: No orders of the defined types were placed in this encounter.   Medication Changes: No orders of the defined types were placed in this encounter.   Current medicines are reviewed at length with the patient today.  The patient {ACTIONS; HAS/DOES NOT HAVE:19233} concerns regarding medicines.  I spent *** minutes reviewing all clinical data during and prior to this visit including all relevant imaging studies, laboratories, clinical information from other health systems and prior notes from both Cardiology and other specialties, interviewing the patient, conducting a complete physical examination, and coordinating care in order to formulate a comprehensive and personalized evaluation and treatment plan.   History of Present Illness:       FOCUSED PROBLEM LIST:   GERD Tobacco abuse  May 2025:  Patient consents to use of AI scribe.*** Patient is a 28 year old male with above medical problems referred for recommendation for right chest pain.  The patient presented to the emergency department in January with epigastric pain accompanied by left  arm pain.  He had eaten some strawberries earlier in the evening and vomited but looked to be red material to be from the strawberries.  He was complaining of chest pain and upper abdomen pain at at that time this woke him up from sleep.  His troponins were negative.  EKG demonstrated early repolarization.  Chest CT ruled out PE, coronary atherosclerosis, cardiac atherosclerosis.  CT abdomen pelvis was also reassuring.  He was ultimately discharged home.         Current Medications: No outpatient medications have been marked as taking for the 09/10/23 encounter (Appointment) with Shelsie Tijerino K, MD.     Review of Systems:   Please see the history of present illness.    All other systems reviewed and are negative.     EKGs/Labs/Other Test Reviewed:   EKG: 2018 normal sinus rhythm with sinus arrhythmia  EKG Interpretation Date/Time:    Ventricular Rate:    PR Interval:    QRS Duration:    QT Interval:    QTC Calculation:   R Axis:      Text Interpretation:           Risk Assessment/Calculations:   {Does this patient have ATRIAL FIBRILLATION?:[id number]}      Physical Exam:   VS:  There were no vitals taken for this visit.   No BP recorded.  {Refresh Note OR Click here to enter BP  :1}***   Wt Readings from Last 3 Encounters:  04/17/17 157 lb (71.2 kg)  GENERAL:  No apparent distress, AOx3 HEENT:  No carotid bruits, +2 carotid impulses, no scleral icterus CAR: RRR Irregular RR*** no murmurs***, gallops, rubs, or thrills RES:  Clear to auscultation bilaterally ABD:  Soft, nontender, nondistended, positive bowel sounds x 4 VASC:  +2 radial pulses, +2 carotid pulses NEURO:  CN 2-12 grossly intact; motor and sensory grossly intact PSYCH:  No active depression or anxiety EXT:  No edema, ecchymosis, or cyanosis  Signed, Elio Haden K Anyi Fels, MD  09/09/2023 2:35 PM    [facility] [address] [place], [place], [place]  [address] Phone: [phone];  Fax: [phone]   Note:  This document was prepared using Dragon voice recognition software and may include unintentional dictation errors.

## 2023-09-10 ENCOUNTER — Ambulatory Visit: Attending: Internal Medicine | Admitting: Internal Medicine

## 2023-09-10 DIAGNOSIS — Z720 Tobacco use: Secondary | ICD-10-CM

## 2023-09-10 DIAGNOSIS — R072 Precordial pain: Secondary | ICD-10-CM

## 2023-09-11 ENCOUNTER — Encounter: Payer: Self-pay | Admitting: Internal Medicine

## 2024-03-06 DIAGNOSIS — R45851 Suicidal ideations: Secondary | ICD-10-CM | POA: Diagnosis not present

## 2024-03-06 DIAGNOSIS — S61512A Laceration without foreign body of left wrist, initial encounter: Secondary | ICD-10-CM | POA: Diagnosis not present

## 2024-03-07 DIAGNOSIS — S61512A Laceration without foreign body of left wrist, initial encounter: Secondary | ICD-10-CM | POA: Diagnosis not present

## 2024-03-07 DIAGNOSIS — R45851 Suicidal ideations: Secondary | ICD-10-CM | POA: Diagnosis not present

## 2024-03-08 DIAGNOSIS — F3289 Other specified depressive episodes: Secondary | ICD-10-CM | POA: Diagnosis not present

## 2024-03-09 DIAGNOSIS — F3289 Other specified depressive episodes: Secondary | ICD-10-CM | POA: Diagnosis not present
